# Patient Record
Sex: Female | Born: 1975 | Race: Black or African American | Hispanic: No | Smoking: Never smoker
Health system: Southern US, Community
[De-identification: ages and names within clinical notes are randomized; demographics above are authoritative.]

## PROBLEM LIST (undated history)

## (undated) DIAGNOSIS — M797 Fibromyalgia: Secondary | ICD-10-CM

## (undated) DIAGNOSIS — J45909 Unspecified asthma, uncomplicated: Secondary | ICD-10-CM

## (undated) DIAGNOSIS — G500 Trigeminal neuralgia: Secondary | ICD-10-CM

## (undated) DIAGNOSIS — G43909 Migraine, unspecified, not intractable, without status migrainosus: Secondary | ICD-10-CM

## (undated) DIAGNOSIS — I10 Essential (primary) hypertension: Secondary | ICD-10-CM

## (undated) DIAGNOSIS — G8929 Other chronic pain: Secondary | ICD-10-CM

## (undated) HISTORY — PX: FRACTURE SURGERY: SHX138

## (undated) HISTORY — PX: APPENDECTOMY: SHX54

---

## 2018-10-13 ENCOUNTER — Other Ambulatory Visit: Payer: Self-pay

## 2018-10-13 ENCOUNTER — Emergency Department (HOSPITAL_COMMUNITY)
Admission: EM | Admit: 2018-10-13 | Discharge: 2018-10-13 | Disposition: A | Discharge status: Home / Self Care | Payer: Medicaid - Out of State | Attending: Emergency Medicine | Admitting: Emergency Medicine

## 2018-10-13 ENCOUNTER — Encounter (HOSPITAL_COMMUNITY): Payer: Self-pay | Admitting: Emergency Medicine

## 2018-10-13 ENCOUNTER — Emergency Department (HOSPITAL_COMMUNITY): Payer: Medicaid - Out of State

## 2018-10-13 DIAGNOSIS — Z885 Allergy status to narcotic agent status: Secondary | ICD-10-CM | POA: Insufficient documentation

## 2018-10-13 DIAGNOSIS — J45909 Unspecified asthma, uncomplicated: Secondary | ICD-10-CM | POA: Diagnosis not present

## 2018-10-13 DIAGNOSIS — R0789 Other chest pain: Secondary | ICD-10-CM | POA: Insufficient documentation

## 2018-10-13 DIAGNOSIS — Z79899 Other long term (current) drug therapy: Secondary | ICD-10-CM | POA: Diagnosis not present

## 2018-10-13 DIAGNOSIS — M797 Fibromyalgia: Secondary | ICD-10-CM | POA: Insufficient documentation

## 2018-10-13 HISTORY — DX: Other chronic pain: G89.29

## 2018-10-13 HISTORY — DX: Unspecified asthma, uncomplicated: J45.909

## 2018-10-13 HISTORY — DX: Fibromyalgia: M79.7

## 2018-10-13 HISTORY — DX: Migraine, unspecified, not intractable, without status migrainosus: G43.909

## 2018-10-13 LAB — URINALYSIS, ROUTINE W REFLEX MICROSCOPIC
Glucose, UA: NEGATIVE mg/dL
Hgb urine dipstick: NEGATIVE
Ketones, ur: 5 mg/dL — AB
Leukocytes,Ua: NEGATIVE
Nitrite: NEGATIVE
Protein, ur: 30 mg/dL — AB
Specific Gravity, Urine: 1.031 — ABNORMAL HIGH (ref 1.005–1.030)
pH: 5 (ref 5.0–8.0)

## 2018-10-13 LAB — BASIC METABOLIC PANEL
Anion gap: 9 (ref 5–15)
BUN: 13 mg/dL (ref 6–20)
CO2: 23 mmol/L (ref 22–32)
Calcium: 9.1 mg/dL (ref 8.9–10.3)
Chloride: 104 mmol/L (ref 98–111)
Creatinine, Ser: 0.74 mg/dL (ref 0.44–1.00)
GFR calc Af Amer: >60 mL/min (ref >60)
GFR calc non Af Amer: >60 mL/min (ref >60)
Glucose, Bld: 95 mg/dL (ref 70–99)
Potassium: 3.6 mmol/L (ref 3.5–5.1)
Sodium: 136 mmol/L (ref 135–145)

## 2018-10-13 LAB — HEPATIC FUNCTION PANEL
ALT: 15 U/L (ref 0–44)
AST: 21 U/L (ref 15–41)
Albumin: 4 g/dL (ref 3.5–5.0)
Alkaline Phosphatase: 98 U/L (ref 38–126)
Bilirubin, Direct: 0.1 mg/dL (ref 0.0–0.2)
Indirect Bilirubin: 0.3 mg/dL (ref 0.3–0.9)
Total Bilirubin: 0.4 mg/dL (ref 0.3–1.2)
Total Protein: 7.5 g/dL (ref 6.5–8.1)

## 2018-10-13 LAB — CBC
HCT: 41.3 % (ref 36.0–46.0)
Hemoglobin: 13.6 g/dL (ref 12.0–15.0)
MCH: 30.8 pg (ref 26.0–34.0)
MCHC: 32.9 g/dL (ref 30.0–36.0)
MCV: 93.4 fL (ref 80.0–100.0)
Platelets: 281 10*3/uL (ref 150–400)
RBC: 4.42 MIL/uL (ref 3.87–5.11)
RDW: 13.1 % (ref 11.5–15.5)
WBC: 10 10*3/uL (ref 4.0–10.5)
nRBC: 0 % (ref 0.0–0.2)

## 2018-10-13 LAB — LIPASE, BLOOD: Lipase: 22 U/L (ref 11–51)

## 2018-10-13 LAB — D-DIMER, QUANTITATIVE: D-Dimer, Quant: <0.27 ug/mL-FEU (ref 0.00–0.50)

## 2018-10-13 LAB — POC URINE PREG, ED: Preg Test, Ur: NEGATIVE

## 2018-10-13 LAB — TROPONIN I (HIGH SENSITIVITY)
Troponin I (High Sensitivity): <2 ng/L (ref <18)
Troponin I (High Sensitivity): <2 ng/L (ref <18)

## 2018-10-13 MED ORDER — MORPHINE SULFATE (PF) 4 MG/ML IV SOLN
4.0000 mg | Freq: Once | INTRAVENOUS | Status: AC
  Administered 2018-10-13: 4 mg via INTRAVENOUS
  Filled 2018-10-13: qty 1

## 2018-10-13 MED ORDER — SODIUM CHLORIDE 0.9% FLUSH
3.0000 mL | Freq: Once | INTRAVENOUS | Status: AC
  Administered 2018-10-13: 3 mL via INTRAVENOUS

## 2018-10-13 MED ORDER — CYCLOBENZAPRINE HCL 10 MG PO TABS
10.0000 mg | ORAL_TABLET | Freq: Once | ORAL | Status: AC
  Administered 2018-10-13: 20:00:00 10 mg via ORAL
  Filled 2018-10-13: qty 1

## 2018-10-13 MED ORDER — KETOROLAC TROMETHAMINE 30 MG/ML IJ SOLN
30.0000 mg | Freq: Once | INTRAMUSCULAR | Status: AC
  Administered 2018-10-13: 30 mg via INTRAVENOUS
  Filled 2018-10-13: qty 1

## 2018-10-13 MED ORDER — CYCLOBENZAPRINE HCL 10 MG PO TABS: 10.0000 mg | ORAL_TABLET | Freq: Three times a day (TID) | ORAL | 0 refills | Status: AC

## 2018-10-13 MED ORDER — ONDANSETRON HCL 4 MG/2ML IJ SOLN
4.0000 mg | Freq: Once | INTRAMUSCULAR | Status: AC
  Administered 2018-10-13: 4 mg via INTRAVENOUS
  Filled 2018-10-13: qty 2

## 2018-10-13 MED ORDER — DICLOFENAC SODIUM 75 MG PO TBEC: 75.0000 mg | DELAYED_RELEASE_TABLET | Freq: Two times a day (BID) | ORAL | 0 refills | Status: AC

## 2018-10-13 MED ORDER — TRAMADOL HCL 50 MG PO TABS: 50.0000 mg | ORAL_TABLET | Freq: Four times a day (QID) | ORAL | 0 refills | Status: AC | PRN

## 2018-10-13 NOTE — ED Provider Notes (Signed)
[facility]ERGENCY DEPARTMENT Provider Note   CSN: [id number] Arrival date & time: 10/13/18  1559     History   Chief Complaint Chief Complaint  Patient presents with  . Chest Pain    HPI Marie Guzman is a 43 y.o. female.     Patient is a 43 year old female who presents to the emergency department with a complaint of chest area pain.  The patient states this problem started about 3 days ago.  She says it is mostly on the right side, it goes to the axilla area and then down her right side.  It hurts when she takes a deep breath.  And it hurts with certain movements.  Today she felt as though she was off balance.  The pain would not respond to ibuprofen or to her anxiety medication and so she came to the emergency department for evaluation.  The patient states that she has had some chest discomfort with panic attacks in the past, but she says this pain feels different.  There is been no injury to the chest.  No recent operations or procedures involving the chest or back.  There was no sweats involved.  There is no vomiting.  The patient denies chills.  There is been no hemoptysis reported.  Patient has no history of liver disease, gallbladder problems, or pancreatitis.  The patient's last bowel movement was this morning.  Patient presents now.  The history is provided by the patient.    Past Medical History:  Diagnosis Date  . Asthma   . Chronic ankle pain   . Chronic back pain   . Fibromyalgia   . Migraine     There are no active problems to display for this patient.   Past Surgical History:  Procedure Laterality Date  . APPENDECTOMY    . CESAREAN SECTION    . FRACTURE SURGERY       OB History    Gravida  2   Para  2   Term  2   Preterm      AB      Living        SAB      TAB      Ectopic      Multiple      Live Births               Home Medications    Prior to Admission medications   Medication Sig Start Date End Date Taking?  Authorizing Provider  AIMOVIG 70 MG/ML SOAJ Inject 70 mg into the skin every 30 (thirty) days. 09/16/18   [provider]  albuterol (VENTOLIN HFA) 108 (90 Base) MCG/ACT inhaler INL 1 TO 2 INHALATIONS PO Q 4 TO 6 H UTD PRN 09/16/18   [provider]  ALPRAZolam Prudy Feeler(XANAX) 1 MG tablet Take 1 mg by mouth every 6 (six) hours. 09/28/18   [provider]  amitriptyline (ELAVIL) 100 MG tablet Take 100 mg by mouth at bedtime. 09/22/18   [provider]  ARIPiprazole (ABILIFY) 2 MG tablet Take 2 mg by mouth daily. 10/03/18   [provider]  chlorhexidine (PERIDEX) 0.12 % solution 15 mLs by Mouth Rinse route 4 (four) times daily. 09/25/18   [provider]  diclofenac sodium (VOLTAREN) 1 % GEL Apply 4 g topically 4 (four) times daily. 10/06/18   [provider]  DULoxetine (CYMBALTA) 60 MG capsule Take 60 mg by mouth daily. 10/05/18   [provider]  escitalopram (LEXAPRO) 10  MG tablet Take 10 mg by mouth daily. 09/16/18   [provider]  fluticasone (FLONASE) 50 MCG/ACT nasal spray Place 2 sprays into both nostrils daily. 08/28/18   [provider]  HYDROcodone-acetaminophen (NORCO) 10-325 MG tablet Take 1 tablet by mouth 2 (two) times daily. 09/28/18   [provider]  methocarbamol (ROBAXIN) 500 MG tablet Take 500 mg by mouth 4 (four) times daily. 09/25/18   [provider]  oxcarbazepine (TRILEPTAL) 600 MG tablet Take 600 mg by mouth 3 (three) times daily. 09/22/18   [provider]  pregabalin (LYRICA) 150 MG capsule Take 150 mg by mouth 2 (two) times daily. 10/03/18   [provider]  promethazine (PHENERGAN) 25 MG tablet Take 25 mg by mouth every 4 (four) hours as needed. 10/05/18   [provider]  SUMAtriptan (IMITREX) 100 MG tablet Take 100 mg by mouth once as needed. 10/06/18   [provider]  tiZANidine (ZANAFLEX) 4 MG tablet Take 4 mg by mouth 2 (two) times daily. 10/03/18    [provider]    Family History Family History  Problem Relation Age of Onset  . Diabetes Mother   . Depression Other   . Hypertension Other   . Cancer Other     Social History Social History   Tobacco Use  . Smoking status: Never Smoker  . Smokeless tobacco: Never Used  Substance Use Topics  . Alcohol use: Never    Frequency: Never  . Drug use: Never     Allergies   Demerol [meperidine hcl]   Review of Systems Review of Systems  Constitutional: Negative for activity change, appetite change and fever.  HENT: Negative for congestion, ear discharge, ear pain, facial swelling, nosebleeds, rhinorrhea, sneezing and tinnitus.   Eyes: Negative for photophobia, pain and discharge.  Respiratory: Positive for chest tightness. Negative for cough, choking, shortness of breath and wheezing.   Cardiovascular: Negative for chest pain, palpitations and leg swelling.  Gastrointestinal: Negative for abdominal pain, blood in stool, constipation, diarrhea, nausea and vomiting.  Genitourinary: Negative for difficulty urinating, dysuria, flank pain, frequency and hematuria.  Musculoskeletal: Negative for back pain, gait problem, myalgias and neck pain.  Skin: Negative for color change, rash and wound.  Neurological: Positive for light-headedness. Negative for dizziness, seizures, syncope, facial asymmetry, speech difficulty, weakness and numbness.  Hematological: Negative for adenopathy. Does not bruise/bleed easily.  Psychiatric/Behavioral: Negative for agitation, confusion, hallucinations, self-injury and suicidal ideas. The patient is not nervous/anxious.      Physical Exam Updated Vital Signs BP 124/81 (BP Location: Right Arm)   Pulse (!) 102   Temp 98.4 F (36.9 C) (Oral)   Resp 12   SpO2 100%   Physical Exam Vitals signs and nursing note reviewed.  Constitutional:      Appearance: She is well-developed. She is not toxic-appearing.  HENT:     Head:  Normocephalic.     Right Ear: Tympanic membrane and external ear normal.     Left Ear: Tympanic membrane and external ear normal.  Eyes:     General: Lids are normal.     Pupils: Pupils are equal, round, and reactive to light.  Neck:     Musculoskeletal: Normal range of motion and neck supple.     Vascular: No carotid bruit.  Cardiovascular:     Rate and Rhythm: Normal rate and regular rhythm.     Pulses: Normal pulses.     Heart sounds: Normal heart sounds. No murmur.  Pulmonary:  Effort: No respiratory distress.     Breath sounds: Normal breath sounds.  Chest:     Chest wall: Tenderness present.    Abdominal:     General: Bowel sounds are normal.     Palpations: Abdomen is soft.     Tenderness: There is no abdominal tenderness. There is no guarding.  Musculoskeletal: Normal range of motion.  Lymphadenopathy:     Head:     Right side of head: No submandibular adenopathy.     Left side of head: No submandibular adenopathy.     Cervical: No cervical adenopathy.  Skin:    General: Skin is warm and dry.  Neurological:     Mental Status: She is alert and oriented to person, place, and time.     Cranial Nerves: No cranial nerve deficit.     Sensory: No sensory deficit.  Psychiatric:        Speech: Speech normal.      ED Treatments / Results  Labs (all labs ordered are listed, but only abnormal results are displayed) Labs Reviewed  CBC  BASIC METABOLIC PANEL  URINALYSIS, ROUTINE W REFLEX MICROSCOPIC  D-DIMER, QUANTITATIVE (NOT AT [facility])  HEPATIC FUNCTION PANEL  LIPASE, BLOOD  POC URINE PREG, ED  TROPONIN I (HIGH SENSITIVITY)    EKG None  Radiology No results found.  Procedures Procedures (including critical care time)  Medications Ordered in ED Medications  morphine 4 MG/ML injection 4 mg (has no administration in time range)  sodium chloride flush (NS) 0.9 % injection 3 mL (3 mLs Intravenous Given 10/13/18 1631)  ondansetron (ZOFRAN) injection 4 mg (4  mg Intravenous Given 10/13/18 1700)     Initial Impression / Assessment and Plan / ED Course  I have reviewed the triage vital signs and the nursing notes.  Pertinent labs & imaging results that were available during my care of the patient were reviewed by me and considered in my medical decision making (see chart for details).          Final Clinical Impressions(s) / ED Diagnoses MDM Vital signs wnl except for mild tachycardia of 102. Pulse ox 100% on room air. Patient speaks in complete sentences without problem.  Some of the patient's pain can be reproduced by palpation on the right side of the sternum and some in the mid sternum and some down at the lower rib area. Electrocardiogram shows a normal sinus rhythm.  There is no evidence of an acute STEMI or life-threatening arrhythmias.  CBC wnl. 5:53pm Reacheck. Pain improving after IV pain med. D Dimer wnl, Chest xray neg for acute problem. Troponin is negative for acute event.  Urine pregnancy is negative.  The basic metabolic panel is negative.  The hepatic function test is normal.  Lipase is normal.  The urine analysis shows a cloudy amber specimen with a specific gravity of 1.031.  There is a bilirubin present.  There is also protein present in the urine.   7:30pm Recheck. Pt states pain is getting worse. Case discussed with Dr Wilson Singer.  No SOB. PUlse ox remains 100%. Heart rate 88/min. LUngs clear and Heart RRR. Pt has tenderness on the right lateral sternal area. Troponin wnl, bmet wnl. Lipase wnl. Will give toradol and flexeril.  Second troponin is negative.  Patient states pain has significantly improved after the Toradol and Flexeril.  The examination and lab work-up at this time suggests probable chest wall pain.  Patient does not have significant pain in the epigastric or the right  upper quadrant at this time.  Doubt pancreatitis or gallbladder issues.  No evidence for kidney stone on the urine analysis test.  There are no  temperature changes or pulse changes or pulse deficits of the upper or lower extremity, doubt acute vascular injury.  Patient will be treated with Flexeril and diclofenac.  Patient will use Ultram for more severe pain.  Patient is to follow-up with her primary physician as soon as possible.  I have discussed these plans with the patient in terms which she understands, and she is in agreement with this plan.  The patient will see the primary physician or return to the emergency department if there are any worsening of her symptoms, changes in her condition, problems, or concerns.   Final diagnoses:  Chest wall pain    ED Discharge Orders         Ordered    cyclobenzaprine (FLEXERIL) 10 MG tablet  3 times daily     10/13/18 2112    diclofenac (VOLTAREN) 75 MG EC tablet  2 times daily     10/13/18 2112    traMADol (ULTRAM) 50 MG tablet  Every 6 hours PRN     10/13/18 2112           Ivery QualeBryant, Ashvin Adelson, PA-C 10/13/18 2152    Raeford RazorKohut, Stephen, MD 10/16/18 [id number]

## 2018-10-13 NOTE — ED Triage Notes (Signed)
Patient reports worsening chest pain x 2 days, radiation under the right breast and into her back. Patient states her balance is off today.

## 2018-10-13 NOTE — Discharge Instructions (Addendum)
Your EKG and heart enzymes(x2) are negative. Your oxygen level has been 100% during your ED visit. Your D-Dimer test for possible blood clot is negative. Your chemistry test is negative and your chest xray is negative. YOur exam suggest a chest wall type pain. Please use diclofenac and flexeril daily. Use Ultram for more severe pain. Flexeril and Ultram may cause drowsiness, and or lightheadedness. Please do not drive or operate machines or participate in activity requiring concentration when taking this medication. Please see Dr Alfonso Ramus or return to the Emergency Dept if pain is not improving, or other symptoms worsen.

## 2018-10-15 LAB — URINE CULTURE: Special Requests: NORMAL

## 2019-12-18 ENCOUNTER — Other Ambulatory Visit: Payer: Self-pay

## 2019-12-18 ENCOUNTER — Emergency Department (HOSPITAL_COMMUNITY): Payer: Medicaid - Out of State

## 2019-12-18 ENCOUNTER — Emergency Department (HOSPITAL_COMMUNITY)
Admission: EM | Admit: 2019-12-18 | Discharge: 2019-12-18 | Disposition: A | Discharge status: Home / Self Care | Payer: Medicaid - Out of State | Attending: Emergency Medicine | Admitting: Emergency Medicine

## 2019-12-18 ENCOUNTER — Encounter (HOSPITAL_COMMUNITY): Payer: Self-pay | Admitting: *Deleted

## 2019-12-18 DIAGNOSIS — J45909 Unspecified asthma, uncomplicated: Secondary | ICD-10-CM | POA: Diagnosis not present

## 2019-12-18 DIAGNOSIS — R072 Precordial pain: Secondary | ICD-10-CM

## 2019-12-18 DIAGNOSIS — L509 Urticaria, unspecified: Secondary | ICD-10-CM | POA: Diagnosis present

## 2019-12-18 HISTORY — DX: Trigeminal neuralgia: G50.0

## 2019-12-18 LAB — CBC WITH DIFFERENTIAL/PLATELET
Abs Immature Granulocytes: 0.01 10*3/uL (ref 0.00–0.07)
Basophils Absolute: 0 10*3/uL (ref 0.0–0.1)
Basophils Relative: 1 %
Eosinophils Absolute: 0.1 10*3/uL (ref 0.0–0.5)
Eosinophils Relative: 1 %
HCT: 36.3 % (ref 36.0–46.0)
Hemoglobin: 12.4 g/dL (ref 12.0–15.0)
Immature Granulocytes: 0 %
Lymphocytes Relative: 46 %
Lymphs Abs: 2.9 10*3/uL (ref 0.7–4.0)
MCH: 32.3 pg (ref 26.0–34.0)
MCHC: 34.2 g/dL (ref 30.0–36.0)
MCV: 94.5 fL (ref 80.0–100.0)
Monocytes Absolute: 0.7 10*3/uL (ref 0.1–1.0)
Monocytes Relative: 11 %
Neutro Abs: 2.5 10*3/uL (ref 1.7–7.7)
Neutrophils Relative %: 41 %
Platelets: 300 10*3/uL (ref 150–400)
RBC: 3.84 MIL/uL — ABNORMAL LOW (ref 3.87–5.11)
RDW: 12.9 % (ref 11.5–15.5)
WBC: 6.1 10*3/uL (ref 4.0–10.5)
nRBC: 0 % (ref 0.0–0.2)

## 2019-12-18 LAB — URINALYSIS, ROUTINE W REFLEX MICROSCOPIC
Bilirubin Urine: NEGATIVE
Glucose, UA: NEGATIVE mg/dL
Hgb urine dipstick: NEGATIVE
Ketones, ur: NEGATIVE mg/dL
Leukocytes,Ua: NEGATIVE
Nitrite: NEGATIVE
Protein, ur: NEGATIVE mg/dL
Specific Gravity, Urine: 1.011 (ref 1.005–1.030)
pH: 6 (ref 5.0–8.0)

## 2019-12-18 LAB — COMPREHENSIVE METABOLIC PANEL
ALT: 23 U/L (ref 0–44)
AST: 22 U/L (ref 15–41)
Albumin: 3.8 g/dL (ref 3.5–5.0)
Alkaline Phosphatase: 93 U/L (ref 38–126)
Anion gap: 7 (ref 5–15)
BUN: 9 mg/dL (ref 6–20)
CO2: 26 mmol/L (ref 22–32)
Calcium: 8.7 mg/dL — ABNORMAL LOW (ref 8.9–10.3)
Chloride: 103 mmol/L (ref 98–111)
Creatinine, Ser: 0.68 mg/dL (ref 0.44–1.00)
GFR, Estimated: >60 mL/min (ref >60)
Glucose, Bld: 85 mg/dL (ref 70–99)
Potassium: 4 mmol/L (ref 3.5–5.1)
Sodium: 136 mmol/L (ref 135–145)
Total Bilirubin: 0.5 mg/dL (ref 0.3–1.2)
Total Protein: 7 g/dL (ref 6.5–8.1)

## 2019-12-18 LAB — TROPONIN I (HIGH SENSITIVITY)
Troponin I (High Sensitivity): <2 ng/L (ref <18)
Troponin I (High Sensitivity): <2 ng/L (ref <18)

## 2019-12-18 MED ORDER — FAMOTIDINE IN NACL 20-0.9 MG/50ML-% IV SOLN
20.0000 mg | Freq: Once | INTRAVENOUS | Status: AC
  Administered 2019-12-18: 20 mg via INTRAVENOUS
  Filled 2019-12-18: qty 50

## 2019-12-18 MED ORDER — PREDNISONE 20 MG PO TABS: 40.0000 mg | ORAL_TABLET | Freq: Every day | ORAL | 0 refills | Status: AC

## 2019-12-18 MED ORDER — METHYLPREDNISOLONE SODIUM SUCC 125 MG IJ SOLR
125.0000 mg | Freq: Once | INTRAMUSCULAR | Status: AC
  Administered 2019-12-18: 125 mg via INTRAVENOUS
  Filled 2019-12-18: qty 2

## 2019-12-18 MED ORDER — EPINEPHRINE 0.3 MG/0.3ML IJ SOAJ: 0.3000 mg | INTRAMUSCULAR | 0 refills | Status: AC | PRN

## 2019-12-18 MED ORDER — KETOROLAC TROMETHAMINE 30 MG/ML IJ SOLN
30.0000 mg | Freq: Once | INTRAMUSCULAR | Status: AC
  Administered 2019-12-18: 30 mg via INTRAVENOUS
  Filled 2019-12-18: qty 1

## 2019-12-18 NOTE — ED Notes (Signed)
Entered room and introduced self to patient. Pt appears to be resting in bed, with no obvious signs of distress noted. Bed is locked in the lowest position, side rails x2, call bell within reach. Cardiac monitor in place with continuous pulse oximetry, pt educated on hourly rounding and call light use and verbalized understanding at this time. Will continue to monitor.

## 2019-12-18 NOTE — Discharge Instructions (Signed)

## 2019-12-18 NOTE — ED Triage Notes (Signed)
Pt has multiple complaints; pt states she started lipitor a few days ago and is having an allergic reaction; pt states she feels hoarse and is having some chest pain and hives all over body; pt is also c/o left groin pain and wants to be checked for a UTI

## 2019-12-18 NOTE — ED Provider Notes (Signed)
Emergency Department Provider Note   I have reviewed the triage vital signs and the nursing notes.   HISTORY  Chief Complaint Allergic Reaction   HPI Marie Guzman is a 44 y.o. female presents to the emergency department for evaluation of allergic type reaction.  Patient recently was started on Lipitor and since starting this medication she has had hives, chest tightness, and occasional tingling in her throat.  No lip or tongue swelling.  No specific shortness of breath.  Her rash is diffuse and itchy.  Patient also concerned that she may have developed a urinary tract infection with some dysuria and frequency.  No fevers or flank pain.  No abdominal discomfort, diarrhea, vomiting.  No radiation of symptoms or other modifying factors.   Past Medical History:  Diagnosis Date  . Asthma   . Chronic ankle pain   . Chronic back pain   . Fibromyalgia   . Migraine   . Trigeminal neuralgia     There are no problems to display for this patient.   Past Surgical History:  Procedure Laterality Date  . APPENDECTOMY    . CESAREAN SECTION    . FRACTURE SURGERY      Allergies Demerol [meperidine hcl], Shellfish allergy, Citric acid, and Lipitor [atorvastatin]  Family History  Problem Relation Age of Onset  . Diabetes Mother   . Depression Other   . Hypertension Other   . Cancer Other     Social History Social History   Tobacco Use  . Smoking status: Never Smoker  . Smokeless tobacco: Never Used  Vaping Use  . Vaping Use: Never used  Substance Use Topics  . Alcohol use: Never  . Drug use: Never    Review of Systems  Constitutional: No fever/chills Eyes: No visual changes. ENT: Positive tingling in the throat.  Cardiovascular: Denies chest pain. Respiratory: Denies shortness of breath. Gastrointestinal: No abdominal pain.  No nausea, no vomiting.  No diarrhea.  No constipation. Genitourinary: Positive for dysuria. Musculoskeletal: Negative for back pain. Skin:  Positive rash.  Neurological: Negative for headaches, focal weakness or numbness.  10-point ROS otherwise negative.  ____________________________________________   PHYSICAL EXAM:  VITAL SIGNS: ED Triage Vitals  Enc Vitals Group     BP 12/18/19 1122 120/76     Pulse Rate 12/18/19 1122 69     Resp --      Temp 12/18/19 1122 98.4 F (36.9 C)     Temp Source 12/18/19 1122 Oral     SpO2 12/18/19 1122 99 %     Weight 12/18/19 1125 217 lb (98.4 kg)     Height 12/18/19 1125 5\' 9"  (1.753 m)   Constitutional: Alert and oriented. Well appearing and in no acute distress. Eyes: Conjunctivae are normal. Head: Atraumatic. Nose: No congestion/rhinnorhea. Mouth/Throat: Mucous membranes are moist.  Oropharynx non-erythematous. Oropharynx is widely patent.  Neck: No stridor.   Cardiovascular: Normal rate, regular rhythm. Good peripheral circulation. Grossly normal heart sounds.   Respiratory: Normal respiratory effort.  No retractions. Lungs CTAB. Gastrointestinal: Soft and nontender. No distention.  Musculoskeletal: No lower extremity tenderness nor edema. No gross deformities of extremities. Neurologic:  Normal speech and language. No gross focal neurologic deficits are appreciated.  Skin:  Skin is warm, dry and intact. No rash noted.  ____________________________________________   LABS (all labs ordered are listed, but only abnormal results are displayed)  Labs Reviewed  URINE CULTURE - Abnormal; Notable for the following components:      Result Value  Culture   (*)    Value: 20,000 COLONIES/mL MULTIPLE SPECIES PRESENT, SUGGEST RECOLLECTION   All other components within normal limits  COMPREHENSIVE METABOLIC PANEL - Abnormal; Notable for the following components:   Calcium 8.7 (*)    All other components within normal limits  CBC WITH DIFFERENTIAL/PLATELET - Abnormal; Notable for the following components:   RBC 3.84 (*)    All other components within normal limits  URINALYSIS,  ROUTINE W REFLEX MICROSCOPIC  TROPONIN I (HIGH SENSITIVITY)  TROPONIN I (HIGH SENSITIVITY)   ____________________________________________  EKG   EKG Interpretation  Date/Time:  Friday December 18 2019 12:06:05 EDT Ventricular Rate:  63 PR Interval:    QRS Duration: 97 QT Interval:  426 QTC Calculation: 437 R Axis:   -8 Text Interpretation: Sinus rhythm Low voltage, precordial leads Abnormal R-wave progression, early transition No STEMI Confirmed by Alona Bene [phone]) on 12/18/2019 12:12:45 PM       ____________________________________________  RADIOLOGY  CXR reviewed ____________________________________________   PROCEDURES  Procedure(s) performed:   Procedures  none ____________________________________________   INITIAL IMPRESSION / ASSESSMENT AND PLAN / ED COURSE  Pertinent labs & imaging results that were available during my care of the patient were reviewed by me and considered in my medical decision making (see chart for details).   Patient presents to the emergency department for evaluation of allergic type reaction.  Does not appear to be in acute anaphylaxis but does have an urticarial rash.  Possibly from Lipitor which is listed as a allergy here but will discuss further with her PCP.  In terms of patient's chest discomfort she has largely unremarkable work-up here.  UA is clear but with symptoms will be sent for culture.  No antibiotics for now.  Discussed PCP follow-up plan and ED return precautions.   ____________________________________________  FINAL CLINICAL IMPRESSION(S) / ED DIAGNOSES  Final diagnoses:  Urticaria  Precordial pain     MEDICATIONS GIVEN DURING THIS VISIT:  Medications  famotidine (PEPCID) IVPB 20 mg premix (0 mg Intravenous Stopped 12/18/19 1312)  methylPREDNISolone sodium succinate (SOLU-MEDROL) 125 mg/2 mL injection 125 mg (125 mg Intravenous Given 12/18/19 1223)  ketorolac (TORADOL) 30 MG/ML injection 30 mg (30 mg  Intravenous Given 12/18/19 1223)     NEW OUTPATIENT MEDICATIONS STARTED DURING THIS VISIT:  Discharge Medication List as of 12/18/2019  3:03 PM    START taking these medications   Details  EPINEPHrine 0.3 mg/0.3 mL IJ SOAJ injection Inject 0.3 mg into the muscle as needed for anaphylaxis., Starting Fri 12/18/2019, Print    predniSONE (DELTASONE) 20 MG tablet Take 2 tablets (40 mg total) by mouth daily for 4 days., Starting Sat 12/19/2019, Until Wed 12/23/2019, Print        Note:  This document was prepared using Dragon voice recognition software and may include unintentional dictation errors.  Alona Bene, MD, [facility]    Mylon Mabey, Arlyss Repress, MD 12/21/19 [phone]

## 2019-12-19 LAB — URINE CULTURE: Culture: 20000 — AB

## 2020-06-30 IMAGING — DX CHEST - 2 VIEW
2 series · 2 of 2 positions shown · non-contrast
Comparison: None.

CLINICAL DATA: Chest pain

EXAM:
CHEST - 2 VIEW

[chest pa]
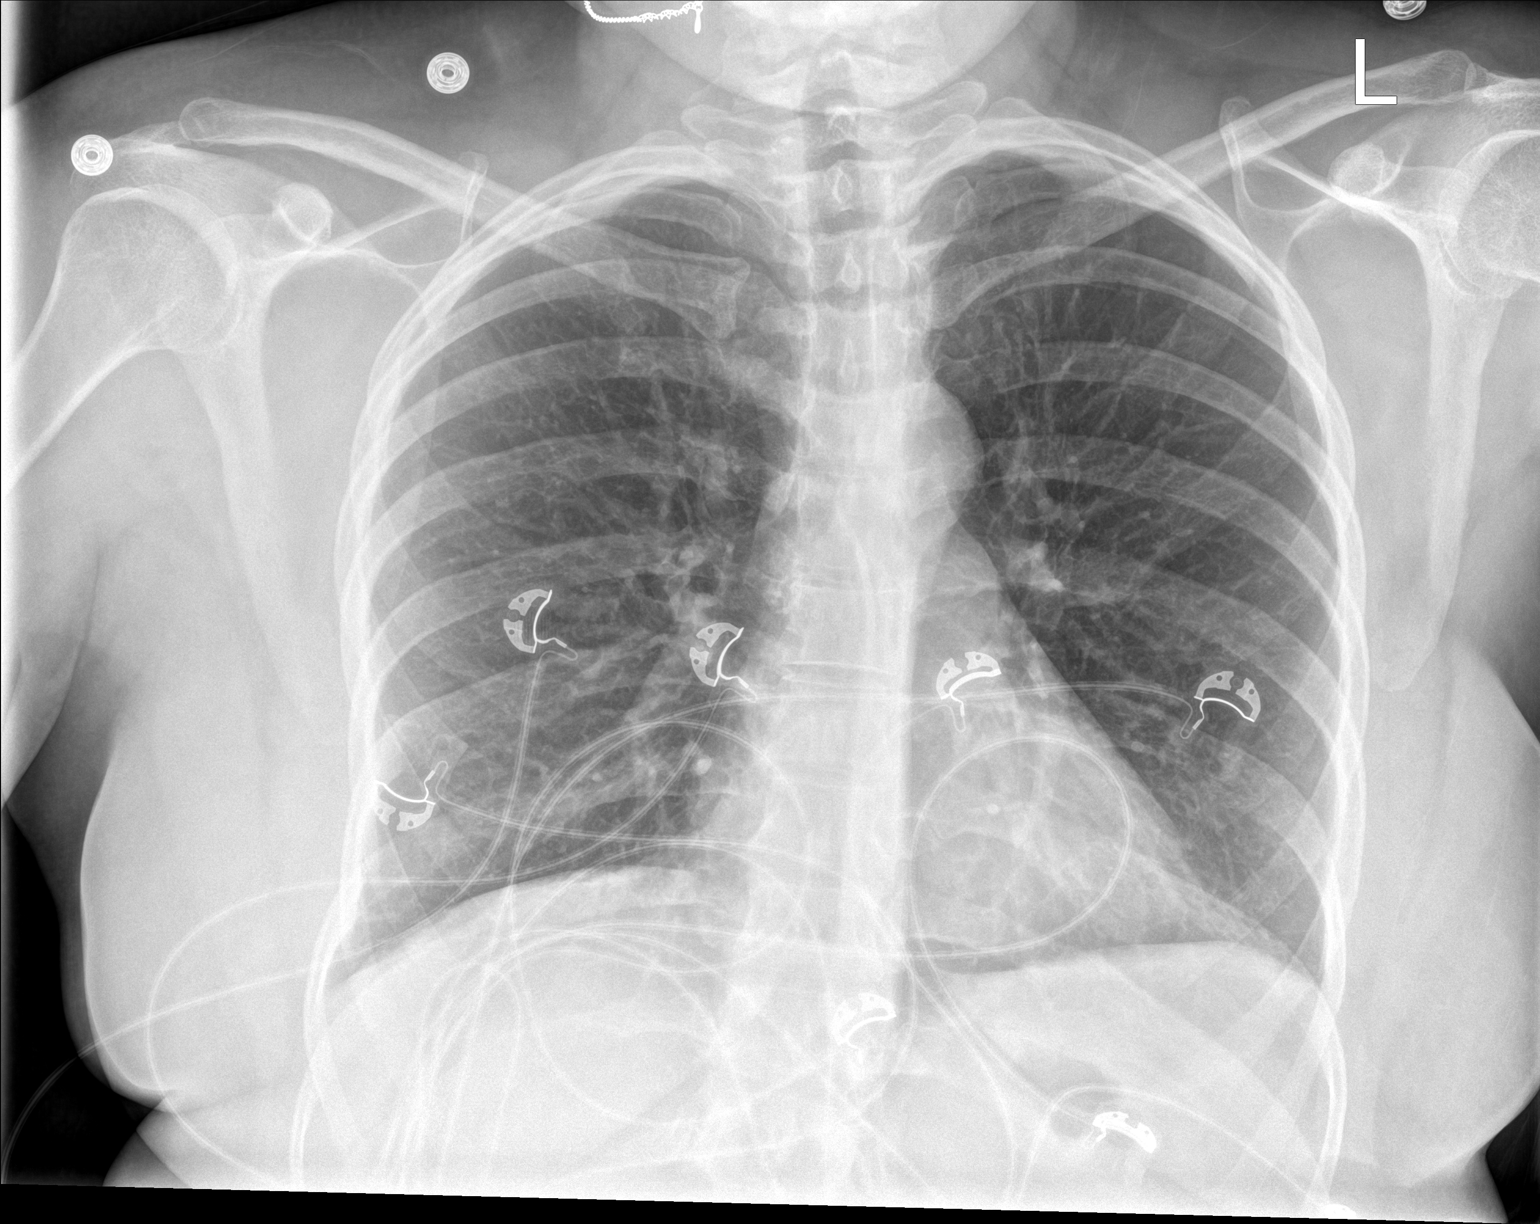

[chest lat]
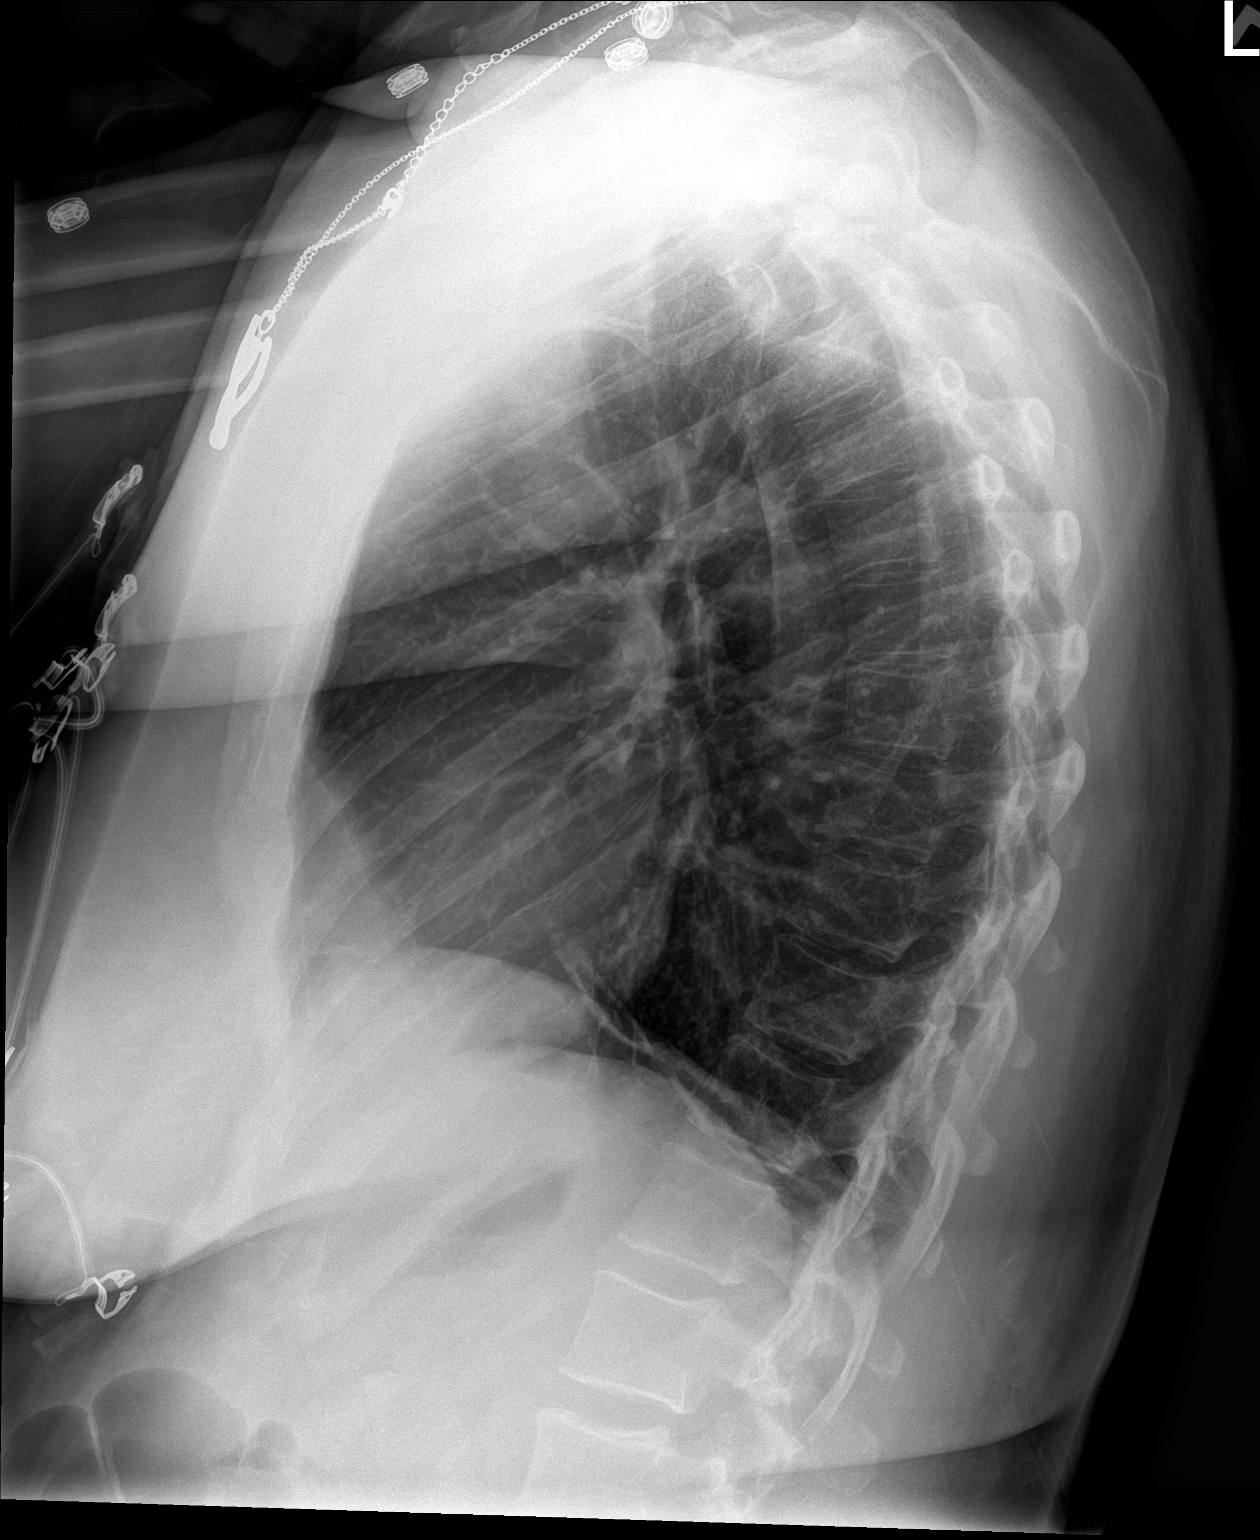

[2 of 2 positions shown; findings below may reference images not displayed]

FINDINGS: The heart size and mediastinal contours are within normal limits.
Both lungs are clear. The visualized skeletal structures are
unremarkable.
IMPRESSION: No acute cardiopulmonary disease.

## 2020-07-31 ENCOUNTER — Other Ambulatory Visit: Payer: Self-pay

## 2020-07-31 DIAGNOSIS — Z20822 Contact with and (suspected) exposure to covid-19: Secondary | ICD-10-CM | POA: Insufficient documentation

## 2020-07-31 DIAGNOSIS — W57XXXA Bitten or stung by nonvenomous insect and other nonvenomous arthropods, initial encounter: Secondary | ICD-10-CM | POA: Diagnosis not present

## 2020-07-31 DIAGNOSIS — I10 Essential (primary) hypertension: Secondary | ICD-10-CM | POA: Insufficient documentation

## 2020-07-31 DIAGNOSIS — S30860A Insect bite (nonvenomous) of lower back and pelvis, initial encounter: Secondary | ICD-10-CM | POA: Diagnosis present

## 2020-07-31 DIAGNOSIS — J45909 Unspecified asthma, uncomplicated: Secondary | ICD-10-CM | POA: Insufficient documentation

## 2020-07-31 DIAGNOSIS — Z7952 Long term (current) use of systemic steroids: Secondary | ICD-10-CM | POA: Insufficient documentation

## 2020-07-31 DIAGNOSIS — R519 Headache, unspecified: Secondary | ICD-10-CM | POA: Diagnosis not present

## 2020-07-31 DIAGNOSIS — Z79899 Other long term (current) drug therapy: Secondary | ICD-10-CM | POA: Diagnosis not present

## 2020-07-31 DIAGNOSIS — R112 Nausea with vomiting, unspecified: Secondary | ICD-10-CM | POA: Insufficient documentation

## 2020-07-31 NOTE — ED Triage Notes (Signed)
Pt arrives via POV c/o Tick Bite in mid lower back on Friday. Pt reports pulling tick off and now is having a large circle of pain in mid lower back. Upon Triage, skin appears intact, and normal. Pt endorses having a fever, headache, chills, tenderness to where she reports being bit by tick and N/V X 4 since Friday.

## 2020-08-01 ENCOUNTER — Emergency Department (HOSPITAL_COMMUNITY): Payer: Medicaid Other

## 2020-08-01 ENCOUNTER — Emergency Department (HOSPITAL_COMMUNITY)
Admission: EM | Admit: 2020-08-01 | Discharge: 2020-08-01 | Disposition: A | Discharge status: Home / Self Care | Payer: Medicaid Other | Attending: Emergency Medicine | Admitting: Emergency Medicine

## 2020-08-01 ENCOUNTER — Encounter (HOSPITAL_COMMUNITY): Payer: Self-pay

## 2020-08-01 DIAGNOSIS — R519 Headache, unspecified: Secondary | ICD-10-CM

## 2020-08-01 DIAGNOSIS — S30860A Insect bite (nonvenomous) of lower back and pelvis, initial encounter: Secondary | ICD-10-CM

## 2020-08-01 HISTORY — DX: Essential (primary) hypertension: I10

## 2020-08-01 LAB — CBC WITH DIFFERENTIAL/PLATELET
Abs Immature Granulocytes: 0.03 10*3/uL (ref 0.00–0.07)
Basophils Absolute: 0 10*3/uL (ref 0.0–0.1)
Basophils Relative: 1 %
Eosinophils Absolute: 0 10*3/uL (ref 0.0–0.5)
Eosinophils Relative: 0 %
HCT: 40.2 % (ref 36.0–46.0)
Hemoglobin: 13.2 g/dL (ref 12.0–15.0)
Immature Granulocytes: 0 %
Lymphocytes Relative: 33 %
Lymphs Abs: 2.6 10*3/uL (ref 0.7–4.0)
MCH: 32.4 pg (ref 26.0–34.0)
MCHC: 32.8 g/dL (ref 30.0–36.0)
MCV: 98.5 fL (ref 80.0–100.0)
Monocytes Absolute: 0.7 10*3/uL (ref 0.1–1.0)
Monocytes Relative: 9 %
Neutro Abs: 4.4 10*3/uL (ref 1.7–7.7)
Neutrophils Relative %: 57 %
Platelets: 283 10*3/uL (ref 150–400)
RBC: 4.08 MIL/uL (ref 3.87–5.11)
RDW: 13 % (ref 11.5–15.5)
WBC: 7.9 10*3/uL (ref 4.0–10.5)
nRBC: 0 % (ref 0.0–0.2)

## 2020-08-01 LAB — COMPREHENSIVE METABOLIC PANEL
ALT: 16 U/L (ref 0–44)
AST: 18 U/L (ref 15–41)
Albumin: 3.8 g/dL (ref 3.5–5.0)
Alkaline Phosphatase: 93 U/L (ref 38–126)
Anion gap: 5 (ref 5–15)
BUN: 15 mg/dL (ref 6–20)
CO2: 27 mmol/L (ref 22–32)
Calcium: 8.4 mg/dL — ABNORMAL LOW (ref 8.9–10.3)
Chloride: 103 mmol/L (ref 98–111)
Creatinine, Ser: 0.79 mg/dL (ref 0.44–1.00)
GFR, Estimated: >60 mL/min (ref >60)
Glucose, Bld: 88 mg/dL (ref 70–99)
Potassium: 3.8 mmol/L (ref 3.5–5.1)
Sodium: 135 mmol/L (ref 135–145)
Total Bilirubin: 0.4 mg/dL (ref 0.3–1.2)
Total Protein: 7.3 g/dL (ref 6.5–8.1)

## 2020-08-01 LAB — RESP PANEL BY RT-PCR (FLU A&B, COVID) ARPGX2
Influenza A by PCR: NEGATIVE
Influenza B by PCR: NEGATIVE
SARS Coronavirus 2 by RT PCR: NEGATIVE

## 2020-08-01 LAB — HCG, QUANTITATIVE, PREGNANCY: hCG, Beta Chain, Quant, S: <1 m[IU]/mL (ref <5)

## 2020-08-01 MED ORDER — DIPHENHYDRAMINE HCL 50 MG/ML IJ SOLN
25.0000 mg | Freq: Once | INTRAMUSCULAR | Status: AC
  Administered 2020-08-01: 25 mg via INTRAVENOUS
  Filled 2020-08-01: qty 1

## 2020-08-01 MED ORDER — METOCLOPRAMIDE HCL 5 MG/ML IJ SOLN
10.0000 mg | Freq: Once | INTRAMUSCULAR | Status: AC
  Administered 2020-08-01: 10 mg via INTRAVENOUS
  Filled 2020-08-01: qty 2

## 2020-08-01 MED ORDER — DOXYCYCLINE HYCLATE 100 MG PO CAPS: 100.0000 mg | ORAL_CAPSULE | Freq: Two times a day (BID) | ORAL | 0 refills | Status: AC

## 2020-08-01 MED ORDER — ONDANSETRON 4 MG PO TBDP: 4.0000 mg | ORAL_TABLET | Freq: Three times a day (TID) | ORAL | 0 refills | Status: AC | PRN

## 2020-08-01 MED ORDER — SODIUM CHLORIDE 0.9 % IV BOLUS
1000.0000 mL | Freq: Once | INTRAVENOUS | Status: AC
  Administered 2020-08-01: 1000 mL via INTRAVENOUS

## 2020-08-01 MED ORDER — IOHEXOL 350 MG/ML SOLN
75.0000 mL | Freq: Once | INTRAVENOUS | Status: AC | PRN
  Administered 2020-08-01: 75 mL via INTRAVENOUS

## 2020-08-01 MED ORDER — KETOROLAC TROMETHAMINE 30 MG/ML IJ SOLN
30.0000 mg | Freq: Once | INTRAMUSCULAR | Status: AC
  Administered 2020-08-01: 30 mg via INTRAVENOUS
  Filled 2020-08-01: qty 1

## 2020-08-01 MED ORDER — DOXYCYCLINE HYCLATE 100 MG PO TABS
100.0000 mg | ORAL_TABLET | Freq: Once | ORAL | Status: AC
  Administered 2020-08-01: 100 mg via ORAL
  Filled 2020-08-01: qty 1

## 2020-08-01 MED ORDER — ONDANSETRON HCL 4 MG/2ML IJ SOLN
4.0000 mg | Freq: Once | INTRAMUSCULAR | Status: AC
  Administered 2020-08-01: 4 mg via INTRAVENOUS
  Filled 2020-08-01: qty 2

## 2020-08-01 NOTE — ED Notes (Signed)
Pt ambulatory to waiting room. Pt verbalized understanding of discharge instructions.   

## 2020-08-01 NOTE — ED Provider Notes (Signed)
[facility] EMERGENCY DEPARTMENT Provider Note   CSN: [id number] Arrival date & time: 07/31/20  2337     History Chief Complaint  Patient presents with  . Insect Bite    Marie Guzman is a 45 y.o. female.  Patient with history of fibromyalgia, hypertension, migraines, chronic pain here with concern for tick bite.  States she pulled a tick off of her lower back 2 days ago.  Since then she said increased pain and swelling in the area.  Denies any rash.  She is believes she had a fever at home with a T-max of 99 but states her normal temperature is 97.  She endorses a gradual onset diffuse headache, chills, low back pain and subjective fever.  Has had multiple episodes of nausea and vomiting in the past 2 days as well.  She has a history of migraines but this headache feels different.  There is a gradual onset and denies thunderclap onset.  Had 2 episodes of vomiting today with some photophobia.  No fever.  No neck pain or stiffness.  No abdominal pain, chest pain or shortness of breath. States she was not having any of these issues prior to the tick bite  The history is provided by the patient.       Past Medical History:  Diagnosis Date  . Asthma   . Chronic ankle pain   . Chronic back pain   . Fibromyalgia   . Hypertension   . Migraine   . Trigeminal neuralgia     There are no problems to display for this patient.   Past Surgical History:  Procedure Laterality Date  . APPENDECTOMY    . CESAREAN SECTION    . FRACTURE SURGERY       OB History    Gravida  2   Para  2   Term  2   Preterm      AB      Living        SAB      IAB      Ectopic      Multiple      Live Births              Family History  Problem Relation Age of Onset  . Diabetes Mother   . Depression Other   . Hypertension Other   . Cancer Other     Social History   Tobacco Use  . Smoking status: Never Smoker  . Smokeless tobacco: Never Used  Vaping Use  . Vaping Use:  Never used  Substance Use Topics  . Alcohol use: Never  . Drug use: Never    Home Medications Prior to Admission medications   Medication Sig Start Date End Date Taking? Authorizing Provider  AIMOVIG 70 MG/ML SOAJ Inject 70 mg into the skin every 30 (thirty) days. 09/16/18   [provider]  albuterol (VENTOLIN HFA) 108 (90 Base) MCG/ACT inhaler Inhale 1-2 puffs into the lungs every 4 (four) hours as needed for wheezing or shortness of breath.  09/16/18   [provider]  ALPRAZolam Prudy Feeler) 1 MG tablet Take 1 mg by mouth every 6 (six) hours. 09/28/18   [provider]  amitriptyline (ELAVIL) 100 MG tablet Take 100 mg by mouth at bedtime. 09/22/18   [provider]  brinzolamide (AZOPT) 1 % ophthalmic suspension Place 1 drop into both eyes every evening.    [provider]  chlorhexidine (PERIDEX) 0.12 % solution 15 mLs by Mouth Rinse  route 4 (four) times daily. 09/25/18   [provider]  cyclobenzaprine (FLEXERIL) 10 MG tablet Take 1 tablet (10 mg total) by mouth 3 (three) times daily. 10/13/18   Ivery QualeBryant, Hobson, PA-C  diclofenac (VOLTAREN) 75 MG EC tablet Take 1 tablet (75 mg total) by mouth 2 (two) times daily. 10/13/18   Ivery QualeBryant, Hobson, PA-C  diclofenac sodium (VOLTAREN) 1 % GEL Apply 4 g topically 4 (four) times daily. For back and ankle pain 10/06/18   [provider]  DULoxetine (CYMBALTA) 60 MG capsule Take 60 mg by mouth at bedtime.  10/05/18   [provider]  EPINEPHrine 0.3 mg/0.3 mL IJ SOAJ injection Inject 0.3 mg into the muscle as needed for anaphylaxis. 12/18/19   Long, Arlyss RepressJoshua G, MD  fluticasone (FLONASE) 50 MCG/ACT nasal spray Place 2 sprays into both nostrils daily. 08/28/18   [provider]  HYDROcodone-acetaminophen (NORCO) 10-325 MG tablet Take 1 tablet by mouth 2 (two) times daily. 09/28/18   [provider]  methocarbamol (ROBAXIN) 500 MG tablet Take 500 mg by mouth 4 (four) times daily. 09/25/18    [provider]  oxcarbazepine (TRILEPTAL) 600 MG tablet Take 600 mg by mouth 3 (three) times daily. 09/22/18   [provider]  pregabalin (LYRICA) 150 MG capsule Take 150 mg by mouth 2 (two) times daily. 10/03/18   [provider]  promethazine (PHENERGAN) 25 MG tablet Take 25 mg by mouth every 4 (four) hours as needed for nausea or vomiting.  10/05/18   [provider]  SUMAtriptan (IMITREX) 100 MG tablet Take 100 mg by mouth once as needed. 10/06/18   [provider]  SUMAtriptan (IMITREX) 6 MG/0.5ML SOLN injection Inject 6 mg into the skin every 2 (two) hours as needed for migraine or headache. May repeat in 2 hours if headache persists or recurs.    [provider]  traMADol (ULTRAM) 50 MG tablet Take 1 tablet (50 mg total) by mouth every 6 (six) hours as needed. 10/13/18   Ivery QualeBryant, Hobson, PA-C    Allergies    Demerol [meperidine hcl], Shellfish allergy, Citric acid, and Lipitor [atorvastatin]  Review of Systems   Review of Systems  Constitutional: Positive for chills, fatigue and fever. Negative for activity change and appetite change.  Eyes: Positive for photophobia.  Respiratory: Negative for cough.   Cardiovascular: Negative for chest pain.  Gastrointestinal: Positive for nausea and vomiting. Negative for abdominal pain.  Genitourinary: Negative for dysuria and hematuria.  Musculoskeletal: Positive for arthralgias and back pain.  Neurological: Positive for weakness and headaches.   all other systems are negative except as noted in the HPI and PMH.   Physical Exam Updated Vital Signs BP (!) 145/92 (BP Location: Right Arm)   Pulse 68   Temp 98.6 F (37 C) (Oral)   Resp 18   Ht 5\' 9"  (1.753 m)   Wt 103 kg   SpO2 98%   BMI 33.52 kg/m   Physical Exam Vitals and nursing note reviewed.  Constitutional:      General: She is not in acute distress.    Appearance: She is well-developed.  HENT:     Head: Normocephalic and  atraumatic.     Mouth/Throat:     Mouth: Mucous membranes are moist.     Pharynx: No oropharyngeal exudate.  Eyes:     Conjunctiva/sclera: Conjunctivae normal.     Pupils: Pupils are equal, round, and reactive to light.  Neck:     Comments: No meningismus.  Cardiovascular:     Rate and Rhythm: Normal rate and regular rhythm.     Heart sounds: Normal heart sounds. No murmur heard.   Pulmonary:     Effort: Pulmonary effort is normal. No respiratory distress.     Breath sounds: Normal breath sounds.  Abdominal:     Palpations: Abdomen is soft.     Tenderness: There is no abdominal tenderness. There is no guarding or rebound.  Musculoskeletal:        General: Tenderness present. Normal range of motion.     Cervical back: Normal range of motion and neck supple.     Comments: Tenderness in the lumbar spine and an area where she says the tick bite was.  There is no obvious rash or swelling.  5/5 strength in bilateral lower extremities. Ankle plantar and dorsiflexion intact. Great toe extension intact bilaterally. +2 DP and PT pulses. +2 patellar reflexes bilaterally. Normal gait.   Skin:    General: Skin is warm.  Neurological:     Mental Status: She is alert and oriented to person, place, and time.     Cranial Nerves: No cranial nerve deficit.     Motor: No abnormal muscle tone.     Coordination: Coordination normal.     Comments:  5/5 strength throughout. CN 2-12 intact.Equal grip strength.   Psychiatric:        Behavior: Behavior normal.     ED Results / Procedures / Treatments   Labs (all labs ordered are listed, but only abnormal results are displayed) Labs Reviewed  COMPREHENSIVE METABOLIC PANEL - Abnormal; Notable for the following components:      Result Value   Calcium 8.4 (*)    All other components within normal limits  RESP PANEL BY RT-PCR (FLU A&B, COVID) ARPGX2  CBC WITH DIFFERENTIAL/PLATELET  HCG, QUANTITATIVE, PREGNANCY  ROCKY MTN SPOTTED FVR ABS  PNL(IGG+IGM)  B. BURGDORFI ANTIBODIES    EKG None  Radiology CT Angio Head W or Wo Contrast  Result Date: 08/01/2020 CLINICAL DATA:  Headache with nausea and vomiting.  Recent tick bite EXAM: CT ANGIOGRAPHY HEAD AND NECK TECHNIQUE: Multidetector CT imaging of the head and neck was performed using the standard protocol during bolus administration of intravenous contrast. Multiplanar CT image reconstructions and MIPs were obtained to evaluate the vascular anatomy. Carotid stenosis measurements (when applicable) are obtained utilizing NASCET criteria, using the distal internal carotid diameter as the denominator. CONTRAST:  21mL OMNIPAQUE IOHEXOL 350 MG/ML SOLN COMPARISON:  None. FINDINGS: CT HEAD FINDINGS Brain: No evidence of acute infarction, hemorrhage, hydrocephalus, extra-axial collection or mass lesion/mass effect. Vascular: No hyperdense vessel or unexpected calcification. Skull: Normal. Negative for fracture or focal lesion. Sinuses: Imaged portions are clear. Orbits: No acute finding. Review of the MIP images confirms the above findings CTA NECK FINDINGS Aortic arch: Normal. Right carotid system: Normal. Left carotid system: Normal. Vertebral arteries: Normal. Skeleton: Carious teeth. Other neck: No evidence of mass or inflammation. Upper chest: Normal Review of the MIP images confirms the above findings CTA HEAD FINDINGS Anterior circulation: Hypoplastic right A1 segment. Vessels are smooth and widely patent. Negative for aneurysm or signs of vascular malformation. Posterior circulation: No stenosis, beading, aneurysm, or branch occlusion. Venous sinuses: Normal Anatomic variants: As above Review of the MIP images confirms the above findings IMPRESSION: Negative CTA of the head and neck. Electronically Signed   By: Marnee Spring M.D.   On: 08/01/2020 07:18   CT Angio Neck W and/or Wo Contrast  Result  Date: 08/01/2020 CLINICAL DATA:  Headache with nausea and vomiting.  Recent tick bite EXAM:  CT ANGIOGRAPHY HEAD AND NECK TECHNIQUE: Multidetector CT imaging of the head and neck was performed using the standard protocol during bolus administration of intravenous contrast. Multiplanar CT image reconstructions and MIPs were obtained to evaluate the vascular anatomy. Carotid stenosis measurements (when applicable) are obtained utilizing NASCET criteria, using the distal internal carotid diameter as the denominator. CONTRAST:  31mL OMNIPAQUE IOHEXOL 350 MG/ML SOLN COMPARISON:  None. FINDINGS: CT HEAD FINDINGS Brain: No evidence of acute infarction, hemorrhage, hydrocephalus, extra-axial collection or mass lesion/mass effect. Vascular: No hyperdense vessel or unexpected calcification. Skull: Normal. Negative for fracture or focal lesion. Sinuses: Imaged portions are clear. Orbits: No acute finding. Review of the MIP images confirms the above findings CTA NECK FINDINGS Aortic arch: Normal. Right carotid system: Normal. Left carotid system: Normal. Vertebral arteries: Normal. Skeleton: Carious teeth. Other neck: No evidence of mass or inflammation. Upper chest: Normal Review of the MIP images confirms the above findings CTA HEAD FINDINGS Anterior circulation: Hypoplastic right A1 segment. Vessels are smooth and widely patent. Negative for aneurysm or signs of vascular malformation. Posterior circulation: No stenosis, beading, aneurysm, or branch occlusion. Venous sinuses: Normal Anatomic variants: As above Review of the MIP images confirms the above findings IMPRESSION: Negative CTA of the head and neck. Electronically Signed   By: Marnee Spring M.D.   On: 08/01/2020 07:18    Procedures Procedures   Medications Ordered in ED Medications  doxycycline (VIBRA-TABS) tablet 100 mg (has no administration in time range)    ED Course  I have reviewed the triage vital signs and the nursing notes.  Pertinent labs & imaging results that were available during my care of the patient were reviewed by me and  considered in my medical decision making (see chart for details).    MDM Rules/Calculators/A&P                         Body aches, subjective fever, chills, headache, back pain after tick bite 2 days ago.  She appears well nontoxic.  Afebrile on arrival.  She has no meningismus.  She has an intact neurological exam.  Concern for likely tickborne illness, possibly [place] spotted fever.  Neurologically intact.  No meningismus.  Reports objective fever at home but afebrile here.  Low suspicion for subarachnoid hemorrhage, meningitis, temporal arteritis.  Will treat empirically with doxycycline for likely tickborne illness.  Will send titers for recommend spotted fever and Lyme disease.  Given her severe headache imaging will be obtained. CT head is negative.  No evidence of vascular abnormality, aneurysm or dissection.  Patient feels improved, tolerating p.o. and ambulatory. Discussed p.o. doxycycline, antiemetics and PCP follow-up. Return precautions discussed Final Clinical Impression(s) / ED Diagnoses Final diagnoses:  Tick bite of lower back, initial encounter  Bad headache    Rx / DC Orders ED Discharge Orders    None       Benedetto Ryder, Jeannett Senior, MD 08/01/20 [phone]

## 2020-08-01 NOTE — Discharge Instructions (Signed)
Take the antibiotic for tick exposure.  You can check the results of your Lyme titers and recommend spotted fever panel online.  Use Tylenol or ibuprofen as needed for aches and headache. Return to the ED with worsening pain, fever, vomiting, confusion, any other concerns.

## 2020-08-03 LAB — ROCKY MTN SPOTTED FVR ABS PNL(IGG+IGM)
RMSF IgG: NEGATIVE
RMSF IgM: 0.51 index (ref 0.00–0.89)

## 2020-08-11 LAB — B. BURGDORFI ANTIBODIES

## 2020-08-11 LAB — MISC LABCORP TEST (SEND OUT): Labcorp test code: 164226

## 2022-04-19 IMAGING — CT CT ANGIO NECK
2 of 12 series · 6 of 35 positions shown · IV contrast (Omnipaque or Isovue)
Comparison: None.

CLINICAL DATA: Headache with nausea and vomiting.  Recent tick bite

EXAM:
CT ANGIOGRAPHY HEAD AND NECK
TECHNIQUE: Multidetector CT imaging of the head and neck was performed using
the standard protocol during bolus administration of intravenous
contrast. Multiplanar CT image reconstructions and MIPs were
obtained to evaluate the vascular anatomy. Carotid stenosis
measurements (when applicable) are obtained utilizing NASCET
criteria, using the distal internal carotid diameter as the
denominator.
CONTRAST:  75mL OMNIPAQUE IOHEXOL 350 MG/ML SOLN

[Series 7: cta head & neck · axial · 0.48mm/px · z∈[+1091,+1308]mm · 4 of 723 slices shown]
[im 145/723  soft-tissue]
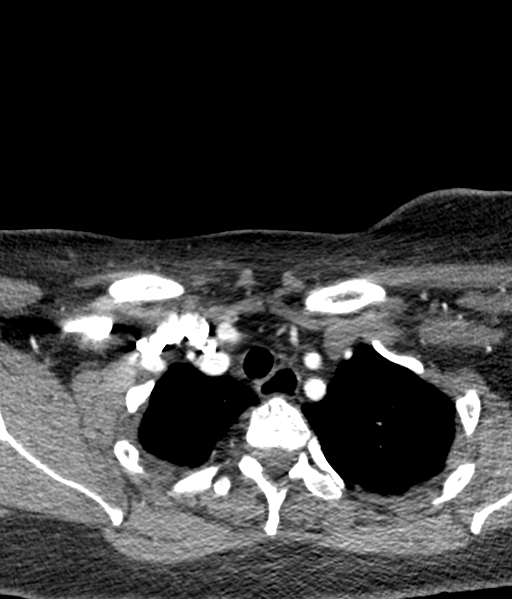
[im 289/723  bone]
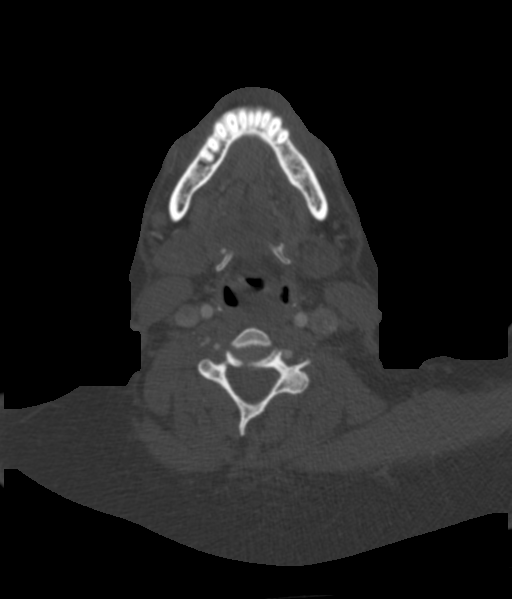
[im 434/723  soft-tissue]
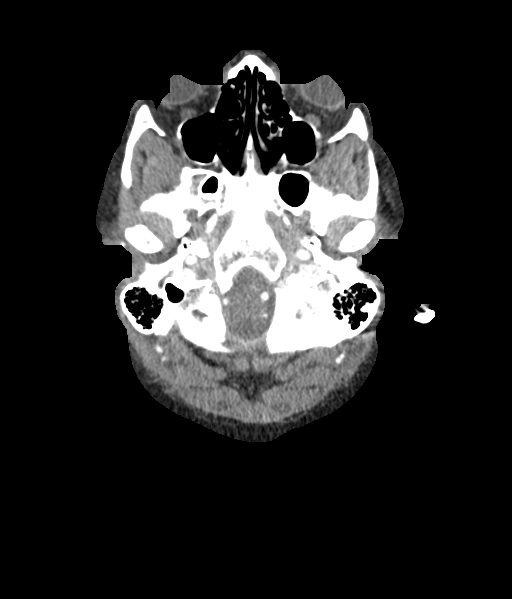
[im 578/723  bone]
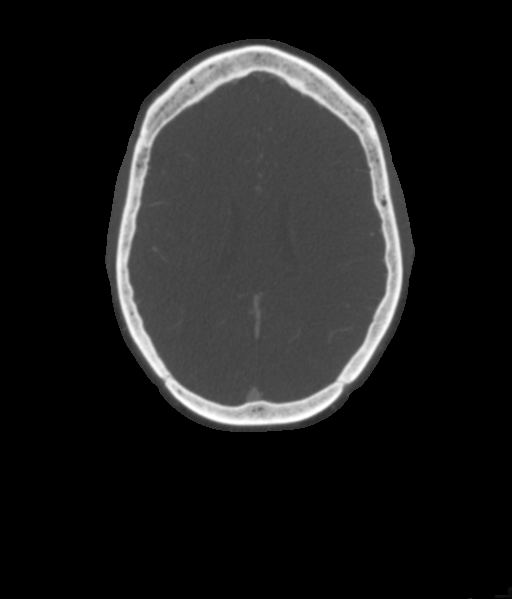

[Series 8: ax thins · axial · 0.42mm/px · z∈[+1141,+1261]mm · 2 of 361 slices shown]
[im 121/361  soft-tissue]
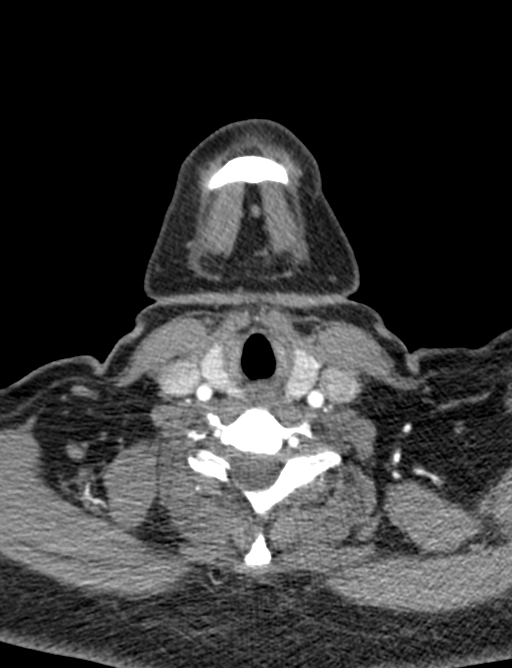
[im 241/361  soft-tissue]
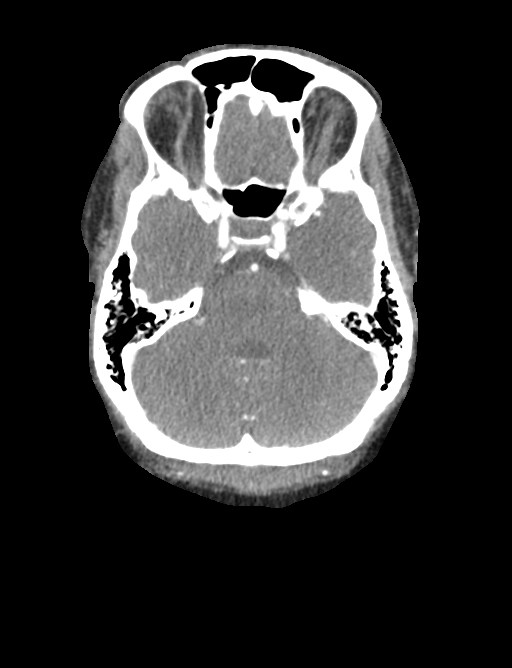

[6 of 35 positions shown; findings below may reference images not displayed]

FINDINGS: CT HEAD FINDINGS

Brain: No evidence of acute infarction, hemorrhage, hydrocephalus,
extra-axial collection or mass lesion/mass effect.

Vascular: No hyperdense vessel or unexpected calcification.

Skull: Normal. Negative for fracture or focal lesion.

Sinuses: Imaged portions are clear.

Orbits: No acute finding.

Review of the MIP images confirms the above findings

CTA NECK FINDINGS

Aortic arch: Normal.

Right carotid system: Normal.

Left carotid system: Normal.

Vertebral arteries: Normal.

Skeleton: Carious teeth.

Other neck: No evidence of mass or inflammation.

Upper chest: Normal

Review of the MIP images confirms the above findings

CTA HEAD FINDINGS

Anterior circulation: Hypoplastic right A1 segment. Vessels are
smooth and widely patent. Negative for aneurysm or signs of vascular
malformation.

Posterior circulation: No stenosis, beading, aneurysm, or branch
occlusion.

Venous sinuses: Normal

Anatomic variants: As above

Review of the MIP images confirms the above findings
IMPRESSION: Negative CTA of the head and neck.
